# Patient Record
Sex: Male | Born: 1950 | Hispanic: Yes | Marital: Married | Smoking: Former smoker
Health system: Southern US, Community
[De-identification: ages and names within clinical notes are randomized; demographics above are authoritative.]

## PROBLEM LIST (undated history)

## (undated) DIAGNOSIS — E785 Hyperlipidemia, unspecified: Secondary | ICD-10-CM

## (undated) DIAGNOSIS — E041 Nontoxic single thyroid nodule: Secondary | ICD-10-CM

## (undated) DIAGNOSIS — I2510 Atherosclerotic heart disease of native coronary artery without angina pectoris: Secondary | ICD-10-CM

## (undated) DIAGNOSIS — I493 Ventricular premature depolarization: Secondary | ICD-10-CM

## (undated) HISTORY — DX: Ventricular premature depolarization: I49.3

## (undated) HISTORY — PX: KNEE SURGERY: SHX244

## (undated) HISTORY — PX: NASAL SINUS SURGERY: SHX719

## (undated) HISTORY — DX: Hyperlipidemia, unspecified: E78.5

## (undated) HISTORY — PX: OTHER SURGICAL HISTORY: SHX169

## (undated) HISTORY — PX: CHOLECYSTECTOMY: SHX55

## (undated) HISTORY — DX: Nontoxic single thyroid nodule: E04.1

## (undated) HISTORY — DX: Atherosclerotic heart disease of native coronary artery without angina pectoris: I25.10

---

## 2017-07-25 LAB — PULMONARY FUNCTION TEST

## 2017-08-16 ENCOUNTER — Other Ambulatory Visit: Payer: Self-pay | Admitting: Otolaryngology

## 2017-08-16 DIAGNOSIS — E041 Nontoxic single thyroid nodule: Secondary | ICD-10-CM

## 2017-08-24 ENCOUNTER — Ambulatory Visit
Admission: RE | Admit: 2017-08-24 | Discharge: 2017-08-24 | Disposition: A | Discharge status: Home / Self Care | Payer: Self-pay | Source: Ambulatory Visit | Attending: Otolaryngology | Admitting: Otolaryngology

## 2017-08-24 ENCOUNTER — Other Ambulatory Visit: Payer: Self-pay

## 2017-08-24 DIAGNOSIS — E041 Nontoxic single thyroid nodule: Secondary | ICD-10-CM

## 2017-08-30 ENCOUNTER — Other Ambulatory Visit: Payer: Self-pay | Admitting: Otolaryngology

## 2017-08-30 DIAGNOSIS — E041 Nontoxic single thyroid nodule: Secondary | ICD-10-CM

## 2017-09-12 ENCOUNTER — Ambulatory Visit
Admission: RE | Admit: 2017-09-12 | Discharge: 2017-09-12 | Disposition: A | Discharge status: Home / Self Care | Payer: PRIVATE HEALTH INSURANCE | Source: Ambulatory Visit | Attending: Otolaryngology | Admitting: Otolaryngology

## 2017-09-12 ENCOUNTER — Other Ambulatory Visit (HOSPITAL_COMMUNITY)
Admission: RE | Admit: 2017-09-12 | Discharge: 2017-09-12 | Disposition: A | Discharge status: Home / Self Care | Payer: PRIVATE HEALTH INSURANCE | Source: Ambulatory Visit | Attending: Student | Admitting: Student

## 2017-09-12 DIAGNOSIS — E041 Nontoxic single thyroid nodule: Secondary | ICD-10-CM

## 2017-09-26 ENCOUNTER — Other Ambulatory Visit: Payer: PRIVATE HEALTH INSURANCE

## 2017-11-05 NOTE — Progress Notes (Signed)
Referring-Prateek Shelly Flatten MD Reason for referral-CAD  HPI: 67 year old male for evaluation of CAD at request of Raelene Bott MD. patient previously followed in [place].  Previously diagnosed with coronary artery disease based on calcium on CT scan.  Multiple records reviewed today.  Chest CT March 2019 showed atherosclerotic calcification.  There was note of a right thyroid nodule and thyroid ultrasound recommended.  Exercise treadmill November 2016 showed no ischemia.  Holter monitor March 2018 showed frequent PVCs (14.9 K in 24 hrs).  Echocardiogram March 2018 showed ejection fraction 60%, mild right ventricular enlargement, mild right atrial enlargement and mild diastolic dysfunction.  Laboratories April 2019 showed potassium 4.1, BUN 10, creatinine 0.98.  Liver functions normal.  Total cholesterol 191 with LDL 97.  Patient denies dyspnea, chest pain, palpitations or syncope.  No claudication.   Current Outpatient Medications  Medication Sig Dispense Refill  . aspirin EC 81 MG tablet Take 2 tablets by mouth daily.    . DOCOSAHEXAENOIC ACID PO Take 0.2 g by mouth daily.     . metoprolol tartrate (LOPRESSOR) 25 MG tablet Take 1 tablet by mouth daily.    . montelukast (SINGULAIR) 10 MG tablet Take 10 mg by mouth daily.    . rosuvastatin (CRESTOR) 10 MG tablet Take 10 mg by mouth daily.    . tamsulosin (FLOMAX) 0.4 MG CAPS capsule Take 0.4 mg by mouth daily.    . vitamin B-12 (CYANOCOBALAMIN) 1000 MCG tablet Take 1 tablet by mouth daily.     No current facility-administered medications for this visit.     Allergies  Allergen Reactions  . Levofloxacin Itching     Past Medical History:  Diagnosis Date  . CAD (coronary artery disease)   . Hyperlipidemia   . PVC's (premature ventricular contractions)   . Thyroid nodule     Past Surgical History:  Procedure Laterality Date  . broken mandible    . CHOLECYSTECTOMY    . KNEE SURGERY    . NASAL SINUS SURGERY        Social History   Socioeconomic History  . Marital status: Married    Spouse name: Not on file  . Number of children: 3  . Years of education: Not on file  . Highest education level: Not on file  Occupational History    Comment: Food distributor  Social Needs  . Financial resource strain: Not on file  . Food insecurity:    Worry: Not on file    Inability: Not on file  . Transportation needs:    Medical: Not on file    Non-medical: Not on file  Tobacco Use  . Smoking status: Former Smoker    Last attempt to quit: 11/13/2002    Years since quitting: 15.0  . Smokeless tobacco: Never Used  Substance and Sexual Activity  . Alcohol use: Yes    Comment: Rare  . Drug use: Not on file  . Sexual activity: Not on file  Lifestyle  . Physical activity:    Days per week: Not on file    Minutes per session: Not on file  . Stress: Not on file  Relationships  . Social connections:    Talks on phone: Not on file    Gets together: Not on file    Attends religious service: Not on file    Active member of club or organization: Not on file    Attends meetings of clubs or organizations: Not on file  Relationship status: Not on file  . Intimate partner violence:    Fear of current or ex partner: Not on file    Emotionally abused: Not on file    Physically abused: Not on file    Forced sexual activity: Not on file  Other Topics Concern  . Not on file  Social History Narrative  . Not on file    Family History  Problem Relation Age of Onset  . COPD Mother   . Alcoholism Father     ROS: no fevers or chills, productive cough, hemoptysis, dysphasia, odynophagia, melena, hematochezia, dysuria, hematuria, rash, seizure activity, orthopnea, PND, pedal edema, claudication. Remaining systems are negative.  Physical Exam:   Blood pressure 106/70, pulse 64, height 5\' 10"  (1.778 m), weight 200 lb (90.7 kg).  General:  Well developed/well nourished in NAD Skin warm/dry Patient not  depressed No peripheral clubbing Back-normal HEENT-normal/normal eyelids Neck supple/normal carotid upstroke bilaterally; no bruits; no JVD; no thyromegaly chest - CTA/ normal expansion CV - RRR/normal S1 and S2; no murmurs, rubs or gallops;  PMI nondisplaced Abdomen -NT/ND, no HSM, no mass, + bowel sounds, no bruit 2+ femoral pulses, no bruits Ext-no edema, chords, 2+ DP Neuro-grossly nonfocal  ECG -normal sinus rhythm at a rate of 64.  Incomplete right bundle branch block.  Personally reviewed  A/P  1 coronary artery disease-patient is not having chest pain.  Plan medical therapy including aspirin and statin.  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  However his LV function is normal and he is not having significant palpitations.  No history of syncope.  Plan to continue beta-blocker.  Will repeat echocardiogram.  If LV function deteriorates may need consideration of antiarrhythmic versus ablation to suppress PVCs as this could potentially cause a cardiomyopathy in the future.  3 hyperlipidemia-continue statin.  Last LDL greater than 70.  Increase Crestor to 20 mg daily.  Check lipids and liver in 4 weeks.  4 thyroid nodule-noted on previous CT scan.  Follow-up biopsy negative per patient.   5 patient will need abdominal ultrasound when he turns 67 to screen for aneurysm.  We will arrange.  Olga MillersBrian Crenshaw, MD

## 2017-11-12 ENCOUNTER — Ambulatory Visit: Payer: PRIVATE HEALTH INSURANCE | Admitting: Cardiology

## 2017-11-12 ENCOUNTER — Encounter: Payer: Self-pay | Admitting: Cardiology

## 2017-11-12 VITALS — BP 106/70 | HR 64 | Ht 70.0 in | Wt 200.0 lb

## 2017-11-12 DIAGNOSIS — E785 Hyperlipidemia, unspecified: Secondary | ICD-10-CM | POA: Diagnosis not present

## 2017-11-12 DIAGNOSIS — Z136 Encounter for screening for cardiovascular disorders: Secondary | ICD-10-CM | POA: Diagnosis not present

## 2017-11-12 DIAGNOSIS — I2510 Atherosclerotic heart disease of native coronary artery without angina pectoris: Secondary | ICD-10-CM

## 2017-11-12 DIAGNOSIS — Z79899 Other long term (current) drug therapy: Secondary | ICD-10-CM | POA: Diagnosis not present

## 2017-11-12 DIAGNOSIS — R002 Palpitations: Secondary | ICD-10-CM

## 2017-11-12 MED ORDER — ROSUVASTATIN CALCIUM 20 MG PO TABS: 20.0000 mg | ORAL_TABLET | Freq: Every day | ORAL | 3 refills | Status: DC

## 2017-11-12 NOTE — Patient Instructions (Signed)
Medication Instructions:  Increase rosuvastatin (Crestor) to 20 mg daily  Labwork: Please return for FASTING labs in 4 weeks (Lipid, Hepatic)  Our in office lab hours are Monday-Friday 8:00-4:00, closed for lunch 12:45-1:45 pm.  No appointment needed.  Testing/Procedures: Your physician has requested that you have an echocardiogram. Echocardiography is a painless test that uses sound waves to create images of your heart. It provides your doctor with information about the size and shape of your heart and how well your heart's chambers and valves are working. This procedure takes approximately one hour. There are no restrictions for this procedure.  [facility]:  [address]  Your physician has requested that you have an abdominal aorta duplex in January 2020. During this test, an ultrasound is used to evaluate the aorta. Allow 30 minutes for this exam. Do not eat after midnight the day before and avoid carbonated beverages  Follow-Up: Your physician wants you to follow-up in: 1 year with Dr. Shelda Palrenshaw You will receive a reminder letter in the mail two months in advance. If you don't receive a letter, please call our office to schedule the follow-up appointment.   Any Other Special Instructions Will Be Listed Below (If Applicable).     If you need a refill on your cardiac medications before your next appointment, please call your pharmacy.

## 2017-12-10 ENCOUNTER — Other Ambulatory Visit: Payer: Self-pay | Admitting: *Deleted

## 2017-12-10 MED ORDER — ROSUVASTATIN CALCIUM 20 MG PO TABS: 20.0000 mg | ORAL_TABLET | Freq: Every day | ORAL | 3 refills | Status: DC

## 2017-12-22 LAB — LIPID PANEL
CHOLESTEROL TOTAL: 129 mg/dL (ref 100–199)
Chol/HDL Ratio: 3.1 ratio (ref 0.0–5.0)
HDL: 42 mg/dL (ref >39)
LDL Calculated: 59 mg/dL (ref 0–99)
Triglycerides: 142 mg/dL (ref 0–149)
VLDL Cholesterol Cal: 28 mg/dL (ref 5–40)

## 2017-12-22 LAB — HEPATIC FUNCTION PANEL
ALBUMIN: 4.4 g/dL (ref 3.6–4.8)
ALT: 17 IU/L (ref 0–44)
AST: 26 IU/L (ref 0–40)
Alkaline Phosphatase: 60 IU/L (ref 39–117)
BILIRUBIN, DIRECT: 0.2 mg/dL (ref 0.00–0.40)
Bilirubin Total: 0.7 mg/dL (ref 0.0–1.2)
TOTAL PROTEIN: 6.9 g/dL (ref 6.0–8.5)

## 2018-03-11 ENCOUNTER — Other Ambulatory Visit (HOSPITAL_COMMUNITY): Payer: 59

## 2018-04-25 ENCOUNTER — Other Ambulatory Visit: Payer: Self-pay

## 2018-04-25 NOTE — Telephone Encounter (Signed)
Order Providers   Prescribing Provider Encounter Provider  Lewayne Buntingrenshaw, Brian S, MD Recardo EvangelistWilliamson, Crystal, CMA  Outpatient Medication Detail    Disp Refills Start End   rosuvastatin (CRESTOR) 20 MG tablet 90 tablet 3 12/10/2017    Sig - Route: Take 1 tablet (20 mg total) by mouth daily. - Oral   Sent to pharmacy as: rosuvastatin (CRESTOR) 20 MG tablet   E-Prescribing Status: Receipt confirmed by pharmacy (12/10/2017 3:31 PM EDT)   Pharm[facility]   [facility] [place] - [phone]

## 2018-04-25 NOTE — Telephone Encounter (Signed)
°*  STAT* If patient is at the pharmacy, call can be transferred to refill team.   1. Which medications need to be refilled? (please list name of each medication and dose if known) Rosuvastatin  2. Which pharmacy/location (including street and city if local pharmacy) is medication to be sent to? [facility]  3. Do they need a 30 day or 90 day supply? 90 and refills

## 2018-04-29 ENCOUNTER — Telehealth: Payer: Self-pay | Admitting: Cardiology

## 2018-04-29 MED ORDER — ROSUVASTATIN CALCIUM 20 MG PO TABS: 20.0000 mg | ORAL_TABLET | Freq: Every day | ORAL | 3 refills | Status: AC

## 2018-04-29 NOTE — Telephone Encounter (Signed)
Refill sent to the pharmacy electronically.  

## 2018-04-29 NOTE — Telephone Encounter (Signed)
° ° ° ° °  1. Which medications need to be refilled? (please list name of each medication and dose if known) rosuvastatin (CRESTOR) 20 MG tablet  2. Which pharmacy/location (including street and city if local pharmacy) is medication to be sent to? [facility] [place] - [phone]   3. Do they need a 30 day or 90 day supply? 90

## 2018-05-13 ENCOUNTER — Ambulatory Visit (HOSPITAL_COMMUNITY)
Admission: RE | Admit: 2018-05-13 | Payer: PRIVATE HEALTH INSURANCE | Source: Ambulatory Visit | Attending: Cardiology | Admitting: Cardiology

## 2018-05-20 ENCOUNTER — Encounter (INDEPENDENT_AMBULATORY_CARE_PROVIDER_SITE_OTHER): Payer: Self-pay

## 2018-05-20 ENCOUNTER — Ambulatory Visit (HOSPITAL_COMMUNITY)
Admission: RE | Admit: 2018-05-20 | Discharge: 2018-05-20 | Disposition: A | Discharge status: Home / Self Care | Payer: Medicare Other | Source: Ambulatory Visit | Attending: Cardiology | Admitting: Cardiology

## 2018-05-20 DIAGNOSIS — Z136 Encounter for screening for cardiovascular disorders: Secondary | ICD-10-CM | POA: Diagnosis present

## 2018-05-20 DIAGNOSIS — Z87891 Personal history of nicotine dependence: Secondary | ICD-10-CM

## 2018-06-05 ENCOUNTER — Encounter: Payer: Self-pay | Admitting: Pulmonary Disease

## 2018-06-05 ENCOUNTER — Ambulatory Visit (INDEPENDENT_AMBULATORY_CARE_PROVIDER_SITE_OTHER): Payer: Medicare Other | Admitting: Pulmonary Disease

## 2018-06-05 ENCOUNTER — Ambulatory Visit (INDEPENDENT_AMBULATORY_CARE_PROVIDER_SITE_OTHER)
Admission: RE | Admit: 2018-06-05 | Discharge: 2018-06-05 | Disposition: A | Discharge status: Home / Self Care | Payer: Medicare Other | Source: Ambulatory Visit | Attending: Pulmonary Disease | Admitting: Pulmonary Disease

## 2018-06-05 VITALS — BP 124/82 | HR 66 | Ht 70.0 in | Wt 198.0 lb

## 2018-06-05 DIAGNOSIS — R05 Cough: Secondary | ICD-10-CM | POA: Diagnosis not present

## 2018-06-05 DIAGNOSIS — R059 Cough, unspecified: Secondary | ICD-10-CM

## 2018-06-05 LAB — CBC WITH DIFFERENTIAL/PLATELET
Basophils Absolute: 0 10*3/uL (ref 0.0–0.1)
Basophils Relative: 0.7 % (ref 0.0–3.0)
Eosinophils Absolute: 0.3 10*3/uL (ref 0.0–0.7)
Eosinophils Relative: 4.4 % (ref 0.0–5.0)
HCT: 45.3 % (ref 39.0–52.0)
Hemoglobin: 15.8 g/dL (ref 13.0–17.0)
LYMPHS ABS: 1.7 10*3/uL (ref 0.7–4.0)
Lymphocytes Relative: 24.2 % (ref 12.0–46.0)
MCHC: 34.8 g/dL (ref 30.0–36.0)
MCV: 89.1 fl (ref 78.0–100.0)
MONOS PCT: 9.3 % (ref 3.0–12.0)
Monocytes Absolute: 0.7 10*3/uL (ref 0.1–1.0)
Neutro Abs: 4.5 10*3/uL (ref 1.4–7.7)
Neutrophils Relative %: 61.4 % (ref 43.0–77.0)
Platelets: 194 10*3/uL (ref 150.0–400.0)
RBC: 5.08 Mil/uL (ref 4.22–5.81)
RDW: 14 % (ref 11.5–15.5)
WBC: 7.2 10*3/uL (ref 4.0–10.5)

## 2018-06-05 LAB — NITRIC OXIDE: Nitric Oxide: 28

## 2018-06-05 MED ORDER — FLUTICASONE-UMECLIDIN-VILANT 100-62.5-25 MCG/INH IN AEPB: 1.0000 | INHALATION_SPRAY | Freq: Every day | RESPIRATORY_TRACT | 1 refills | Status: AC

## 2018-06-05 NOTE — Patient Instructions (Addendum)
We will start chlorpheniramine 8 mg 3 times daily for reduction of postnasal drip.  This medication is available over-the-counter Continue the Flonase nasal spray Start you on trelegy inhaler for chest congestion, wheezing We will reevaluate her lungs with a chest x-ray, PFTs, CBC differential, IgE, alpha-1 antitrypsin levels and phenotype Follow-up in 2 to 4 weeks.

## 2018-06-05 NOTE — Progress Notes (Signed)
Patient seen in the office today and instructed on use of Trelegy.  Patient expressed understanding and demonstrated technique.  

## 2018-06-05 NOTE — Progress Notes (Signed)
Jonathan Santana    [id number]    Nov 22, 1950  Primary Care Physician:Morrow, Clifton CustardAaron, MD  Referring Physician: Farris HasMorrow, Aaron, MD [address] [place], [place] [address]  Chief complaint: Consult for persistent cough, wheezing  HPI: 68 year old with significant history of coronary artery disease, hyperlipidemia, chronic sinusitis Complains of constant cough with postnasal drip, sinus congestion since fall 2019.  Reports symptoms began after he got a flu shot.  He also has mild dyspnea on exertion, wheezing at night He seen his primary care and given Tessalon Perles and Mucinex which helps with symptoms.   Followed by ENT for chronic sinusitis.  Underwent bilateral endoscopic sinus surgery and inferior turbinate reduction on 10/18/2017 Moved from [place] a year ago. [place] and was told lungs are normal. Reports episodes of bronchitis with congestion every year for which she receives antibiotics and prednisone.   Never been on any inhalers.  Denies GERD symptoms. He was evaluated by allergy specialist last year and allergy testing was negative per patient.  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  Quit in 2004 Travel history: Originally from [place].  [place]oved to [place] in [place] Relevant family history: Parents and sisters had emphysema.  They were all smokers.  Has a strong family history of asthma.  Outpatient Encounter Medications as of 06/05/2018  Medication Sig  . aspirin EC 81 MG tablet Take 2 tablets by mouth daily.  . metoprolol tartrate (LOPRESSOR) 25 MG tablet Take 1 tablet by mouth daily.  . montelukast (SINGULAIR) 10 MG tablet Take 10 mg by mouth daily.  . rosuvastatin (CRESTOR) 20 MG tablet Take 1 tablet (20 mg total) by mouth daily.  . tamsulosin (FLOMAX) 0.4 MG CAPS  capsule Take 0.4 mg by mouth daily.  . [DISCONTINUED] DOCOSAHEXAENOIC ACID PO Take 0.2 g by mouth daily.   . [DISCONTINUED] vitamin B-12 (CYANOCOBALAMIN) 1000 MCG tablet Take 1 tablet by mouth daily.   No facility-administered encounter medications on file as of 06/05/2018.     Allergies as of 06/05/2018 - Review Complete 06/05/2018  Allergen Reaction Noted  . Levofloxacin Itching 08/17/2017    Past Medical History:  Diagnosis Date  . CAD (coronary artery disease)   . Hyperlipidemia   . PVC's (premature ventricular contractions)   . Thyroid nodule     Past Surgical History:  Procedure Laterality Date  . broken mandible    . CHOLECYSTECTOMY    . KNEE SURGERY    . NASAL SINUS SURGERY      Family History  Problem Relation Age of Onset  . COPD Mother   . Asthma Mother   . Alcoholism Father   . COPD Sister   . Asthma Sister     Social History   Socioeconomic History  . Marital status: Married    Spouse name: Not on file  . Number of children: 3  . Years of education: Not on file  . Highest education level: Not on file  Occupational History    Comment: Food distributor  Social Needs  . Financial resource strain: Not on file  . Food insecurity:    Worry: Not on file    Inability: Not on file  . Transportation needs:    Medical: Not on file    Non-medical: Not on file  Tobacco  Use  . Smoking status: Former Smoker    Last attempt to quit: 11/13/2002    Years since quitting: 15.5  . Smokeless tobacco: Never Used  Substance and Sexual Activity  . Alcohol use: Yes    Alcohol/week: 1.0 standard drinks    Types: 1 Glasses of wine per week    Comment: Rare  . Drug use: Not on file  . Sexual activity: Not on file  Lifestyle  . Physical activity:    Days per week: Not on file    Minutes per session: Not on file  . Stress: Not on file  Relationships  . Social connections:    Talks on phone: Not on file    Gets together: Not on file    Attends religious service:  Not on file    Active member of club or organization: Not on file    Attends meetings of clubs or organizations: Not on file    Relationship status: Not on file  . Intimate partner violence:    Fear of current or ex partner: Not on file    Emotionally abused: Not on file    Physically abused: Not on file    Forced sexual activity: Not on file  Other Topics Concern  . Not on file  Social History Narrative  . Not on file    Review of systems: Review of Systems  Constitutional: Negative for fever and chills.  HENT: Negative.   Eyes: Negative for blurred vision.  Respiratory: as per HPI  Cardiovascular: Negative for chest pain and palpitations.  Gastrointestinal: Negative for vomiting, diarrhea, blood per rectum. Genitourinary: Negative for dysuria, urgency, frequency and hematuria.  Musculoskeletal: Negative for myalgias, back pain and joint pain.  Skin: Negative for itching and rash.  Neurological: Negative for dizziness, tremors, focal weakness, seizures and loss of consciousness.  Endo/Heme/Allergies: Negative for environmental allergies.  Psychiatric/Behavioral: Negative for depression, suicidal ideas and hallucinations.  All other systems reviewed and are negative.  Physical Exam: Blood pressure 124/82, pulse 66, height 5\' 10"  (1.778 m), weight 198 lb (89.8 kg), SpO2 95 %. Gen:      No acute distress HEENT:  EOMI, sclera anicteric Neck:     No masses; no thyromegaly Lungs:    Clear to auscultation bilaterally; normal respiratory effort CV:         Regular rate and rhythm; no murmurs Abd:      + bowel sounds; soft, non-tender; no palpable masses, no distension Ext:    No edema; adequate peripheral perfusion Skin:      Warm and dry; no rash Neuro: alert and oriented x 3 Psych: normal mood and affect  Data Reviewed: PFTs: 07/25/2017 (from [place]) FVC 4.69 [95%], FEV1 3.16 [94%), F/F 67 Mild obstructive airway disease.  FENO 06/05/2018-28  Assessment:  Assessment  for chronic cough with wheezing He has significant component of of upper airway cough from postnasal drip, chronic sinusitis He is also a ex smoker with mild obstructive airway disease on prior PFTs.  I suspect he may have underlying COPD  For postnasal drip he will continue Singulair, Flonase Start him on chlorpheniramine 8 mg 3 times daily Start Symbicort inhaler for wheezing Check CBC differential, IgE, alpha-1 antitrypsin Will order PFTs and chest x-ray for evaluation of the lungs.  Plan/Recommendations: - Chlorpheniramine - Continue Singulair, Flonase - Start Symbicort - PFTs, chest x-ray - CBC, IgE, alpha-1 antitrypsin  Chilton Greathouse MD [facility] 06/05/2018, 10:07 AM  CC: Farris Has,  MD   

## 2018-06-05 NOTE — Addendum Note (Signed)
Addended by: Demetrio Lapping E on: 06/05/2018 10:54 AM   Modules accepted: Orders

## 2018-06-06 ENCOUNTER — Telehealth: Payer: Self-pay | Admitting: Pulmonary Disease

## 2018-06-06 MED ORDER — FLUTICASONE-UMECLIDIN-VILANT 100-62.5-25 MCG/INH IN AEPB: 1.0000 | INHALATION_SPRAY | Freq: Every day | RESPIRATORY_TRACT | 0 refills | Status: AC

## 2018-06-06 NOTE — Telephone Encounter (Signed)
Called and spoke to pt.  Pt is requesting sample of Trelegy.  Pt also made aware that there is not a generic of Trelegy.  One sample of Trelegy has been placed up front for pick up. Nothing further is needed.

## 2018-06-13 LAB — IGE: IgE (Immunoglobulin E), Serum: 87 kU/L (ref <=114)

## 2018-06-13 LAB — ALPHA-1 ANTITRYPSIN PHENOTYPE: A-1 Antitrypsin, Ser: 156 mg/dL (ref 83–199)

## 2018-06-26 ENCOUNTER — Ambulatory Visit (INDEPENDENT_AMBULATORY_CARE_PROVIDER_SITE_OTHER): Payer: Medicare Other | Admitting: Pulmonary Disease

## 2018-06-26 ENCOUNTER — Encounter: Payer: Self-pay | Admitting: Pulmonary Disease

## 2018-06-26 VITALS — BP 122/74 | HR 60 | Ht 69.5 in | Wt 198.0 lb

## 2018-06-26 DIAGNOSIS — R05 Cough: Secondary | ICD-10-CM | POA: Diagnosis not present

## 2018-06-26 DIAGNOSIS — J4530 Mild persistent asthma, uncomplicated: Secondary | ICD-10-CM

## 2018-06-26 DIAGNOSIS — J449 Chronic obstructive pulmonary disease, unspecified: Secondary | ICD-10-CM

## 2018-06-26 DIAGNOSIS — R059 Cough, unspecified: Secondary | ICD-10-CM

## 2018-06-26 LAB — PULMONARY FUNCTION TEST
DL/VA % pred: 73 %
DL/VA: 3.03 ml/min/mmHg/L
DLCO cor % pred: 84 %
DLCO cor: 22.26 ml/min/mmHg
DLCO unc % pred: 87 %
DLCO unc: 22.98 ml/min/mmHg
FEF 25-75 Post: 2.84 L/sec
FEF 25-75 Pre: 1.54 L/sec
FEF2575-%Change-Post: 84 %
FEF2575-%Pred-Post: 106 %
FEF2575-%Pred-Pre: 57 %
FEV1-%CHANGE-POST: 17 %
FEV1-%Pred-Post: 104 %
FEV1-%Pred-Pre: 88 %
FEV1-POST: 3.51 L
FEV1-Pre: 2.99 L
FEV1FVC-%Change-Post: 6 %
FEV1FVC-%Pred-Pre: 88 %
FEV6-%Change-Post: 11 %
FEV6-%Pred-Post: 114 %
FEV6-%Pred-Pre: 102 %
FEV6-Post: 4.9 L
FEV6-Pre: 4.39 L
FEV6FVC-%Change-Post: 1 %
FEV6FVC-%Pred-Post: 103 %
FEV6FVC-%Pred-Pre: 102 %
FVC-%Change-Post: 10 %
FVC-%PRED-POST: 110 %
FVC-%PRED-PRE: 100 %
FVC-PRE: 4.54 L
FVC-Post: 5 L
PRE FEV6/FVC RATIO: 97 %
Post FEV1/FVC ratio: 70 %
Post FEV6/FVC ratio: 98 %
Pre FEV1/FVC ratio: 66 %
RV % pred: 115 %
RV: 2.69 L
TLC % pred: 110 %
TLC: 7.64 L

## 2018-06-26 NOTE — Progress Notes (Signed)
Jonathan Santana    [id number]    08-22-1950  Primary Care Physician:Morrow, Clifton Custard, MD  Referring Physician: Farris Has, MD [address] [place], [place] [address]  Chief complaint: Follow-up for mild COPD, asthma.  HPI: 68 year old with significant history of coronary artery disease, hyperlipidemia, chronic sinusitis Complains of constant cough with postnasal drip, sinus congestion since fall 2019.  Reports symptoms began after he got a flu shot.  He also has mild dyspnea on exertion, wheezing at night He seen his primary care and given Tessalon Perles and Mucinex which helps with symptoms.   Followed by ENT for chronic sinusitis.  Underwent bilateral endoscopic sinus surgery and inferior turbinate reduction on 10/18/2017 Moved from [place] a year ago. He had PFTs done in 2019 at [place] and was told lungs are normal. Reports episodes of bronchitis with congestion every year for which he receives antibiotics and prednisone.   Never been on any inhalers.  Denies GERD symptoms. He was evaluated by allergy specialist last year and allergy testing was negative per patient.  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  Quit in 2004 Travel history: Originally from [place].  Moved to [place] in 2019 Relevant family history: Parents and sisters had emphysema.  They were all smokers.  Has a strong family history of asthma.  Interim history: Started on chlorphentermine, Flonase and Trelegy at last visit States that his cough, chest congestion is much improved.  Does not notice wheezing at night  Outpatient Encounter Medications as of 06/26/2018  Medication Sig  . aspirin EC 81 MG tablet Take 2 tablets by mouth daily.  . metoprolol tartrate (LOPRESSOR) 25 MG tablet Take 1 tablet by mouth daily.  . montelukast  (SINGULAIR) 10 MG tablet Take 10 mg by mouth daily.  . rosuvastatin (CRESTOR) 20 MG tablet Take 1 tablet (20 mg total) by mouth daily.  . tamsulosin (FLOMAX) 0.4 MG CAPS capsule Take 0.4 mg by mouth 2 (two) times daily.    No facility-administered encounter medications on file as of 06/26/2018.     Allergies as of 06/26/2018 - Review Complete 06/26/2018  Allergen Reaction Noted  . Levofloxacin Itching 08/17/2017    Past Medical History:  Diagnosis Date  . CAD (coronary artery disease)   . Hyperlipidemia   . PVC's (premature ventricular contractions)   . Thyroid nodule     Past Surgical History:  Procedure Laterality Date  . broken mandible    . CHOLECYSTECTOMY    . KNEE SURGERY    . NASAL SINUS SURGERY      Family History  Problem Relation Age of Onset  . COPD Mother   . Asthma Mother   . Alcoholism Father   . COPD Sister   . Asthma Sister     Social History   Socioeconomic History  . Marital status: Married    Spouse name: Not on file  . Number of children: 3  . Years of education: Not on file  . Highest education level: Not on file  Occupational History    Comment: Food distributor  Social Needs  . Financial resource strain: Not on file  . Food insecurity:    Worry: Not on file    Inability: Not on file  . Transportation needs:    Medical: Not on file    Non-medical: Not  on file  Tobacco Use  . Smoking status: Former Smoker    Last attempt to quit: 11/13/2002    Years since quitting: 15.6  . Smokeless tobacco: Never Used  Substance and Sexual Activity  . Alcohol use: Yes    Alcohol/week: 1.0 standard drinks    Types: 1 Glasses of wine per week    Comment: Rare  . Drug use: Not on file  . Sexual activity: Not on file  Lifestyle  . Physical activity:    Days per week: Not on file    Minutes per session: Not on file  . Stress: Not on file  Relationships  . Social connections:    Talks on phone: Not on file    Gets together: Not on file     Attends religious service: Not on file    Active member of club or organization: Not on file    Attends meetings of clubs or organizations: Not on file    Relationship status: Not on file  . Intimate partner violence:    Fear of current or ex partner: Not on file    Emotionally abused: Not on file    Physically abused: Not on file    Forced sexual activity: Not on file  Other Topics Concern  . Not on file  Social History Narrative  . Not on file    Review of systems: Review of Systems  Constitutional: Negative for fever and chills.  HENT: Negative.   Eyes: Negative for blurred vision.  Respiratory: as per HPI  Cardiovascular: Negative for chest pain and palpitations.  Gastrointestinal: Negative for vomiting, diarrhea, blood per rectum. Genitourinary: Negative for dysuria, urgency, frequency and hematuria.  Musculoskeletal: Negative for myalgias, back pain and joint pain.  Skin: Negative for itching and rash.  Neurological: Negative for dizziness, tremors, focal weakness, seizures and loss of consciousness.  Endo/Heme/Allergies: Negative for environmental allergies.  Psychiatric/Behavioral: Negative for depression, suicidal ideas and hallucinations.  All other systems reviewed and are negative.  Physical Exam: Blood pressure 124/82, pulse 66, height 5\' 10"  (1.778 m), weight 198 lb (89.8 kg), SpO2 95 %. Gen:      No acute distress HEENT:  EOMI, sclera anicteric Neck:     No masses; no thyromegaly Lungs:    Clear to auscultation bilaterally; normal respiratory effort CV:         Regular rate and rhythm; no murmurs Abd:      + bowel sounds; soft, non-tender; no palpable masses, no distension Ext:    No edema; adequate peripheral perfusion Skin:      Warm and dry; no rash Neuro: alert and oriented x 3 Psych: normal mood and affect  Data Reviewed: Imaging: X-ray 06/05/2018- acute cardiopulmonary abnormality.  I have reviewed the images personally.  PFTs: 07/25/2017 (from [place]) FVC 4.69 [95%], FEV1 3.16 [94%), F/F 67 Mild obstructive airway disease.  FENO 06/05/2018-28  Assessment:  Mild COPD, asthma PFTs reviewed with mild obstruction, bronchodilator response He also he has significant component of of upper airway cough from postnasal drip, chronic sinusitis Sometimes overall improved from last visit  For postnasal drip he will continue Singulair, Flonase He can use chlorphentermine as needed going forward  Currently on Trelegy inhaler and wants to stop the inhaler as breathing is better We will observe him off inhalers.  He wants to follow-up as needed. Advised him to call back if his symptoms worsen.  Plan/Recommendations: - Chlorpheniramine PRN - Continue Singulair, Flonase  Chilton Greathouse MD [facility] 06/26/2018, 11:59 AM  CC: Farris HasMorrow, Aaron, MD

## 2018-06-26 NOTE — Progress Notes (Signed)
PFT done today. 

## 2018-06-26 NOTE — Patient Instructions (Signed)
I am glad you are starting to feel better If your symptoms recur then you can use over-the-counter antihistamine and continue on the Flonase nasal spray Follow-up in clinic as needed.

## 2018-11-26 IMAGING — US US THYROID
1 series · 13 of 25 positions shown · non-contrast
Comparison: None available

CLINICAL DATA: Right thyroid nodule

EXAM:
THYROID ULTRASOUND
TECHNIQUE: Ultrasound examination of the thyroid gland and adjacent soft
tissues was performed.

[Series 1: us thyroid · 0.05mm/px · 13 of 54 slices shown]
[im 1/54]
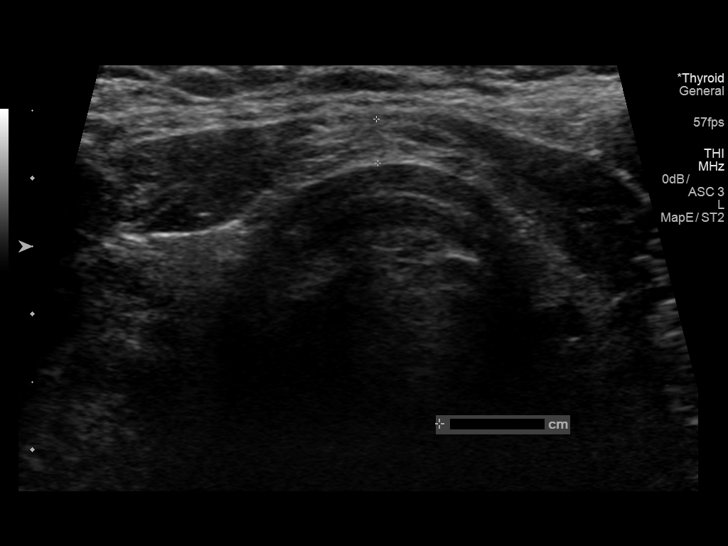
[im 5/54]
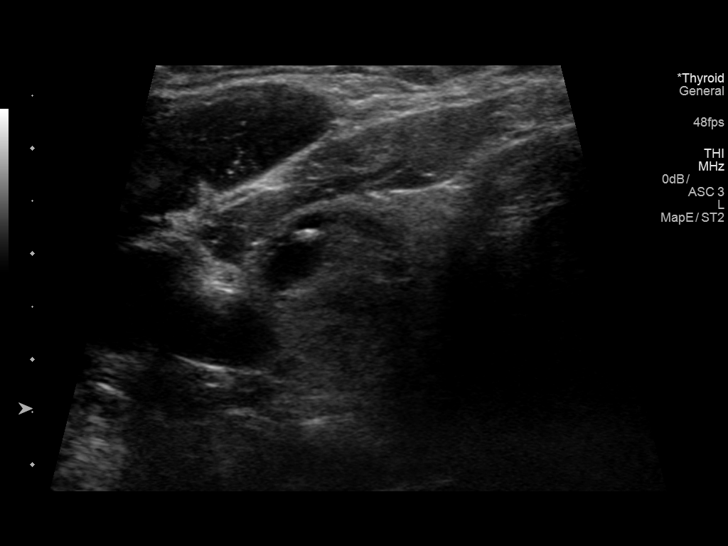
[im 9/54]
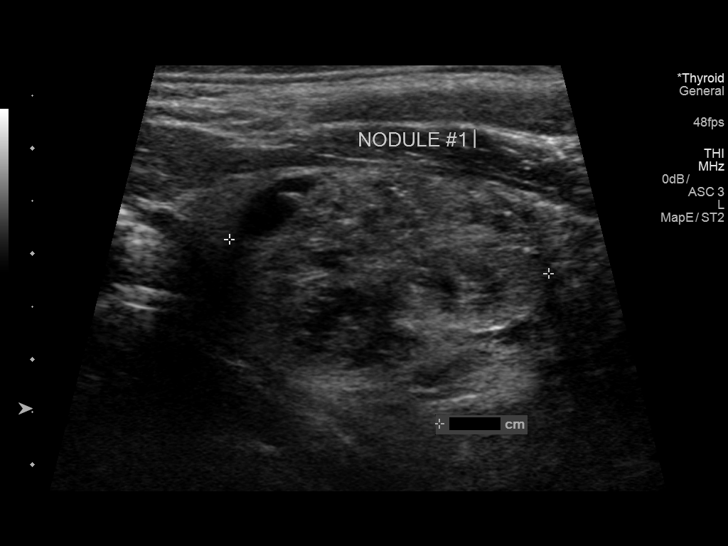
[im 14/54]
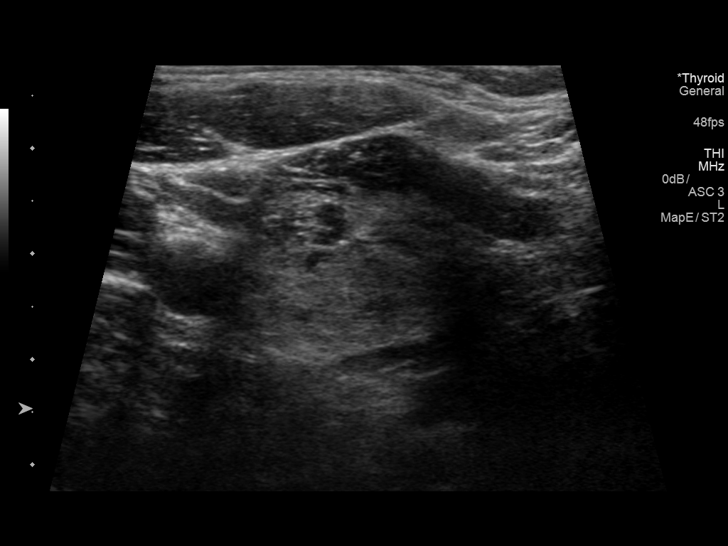
[im 18/54]
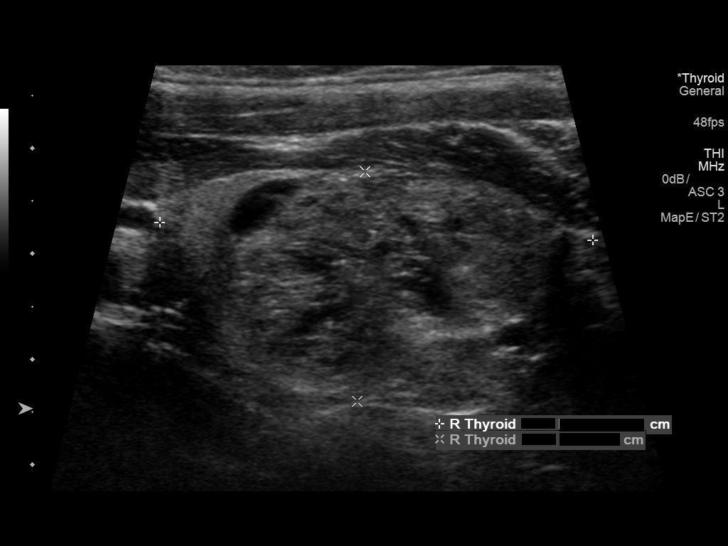
[im 23/54]
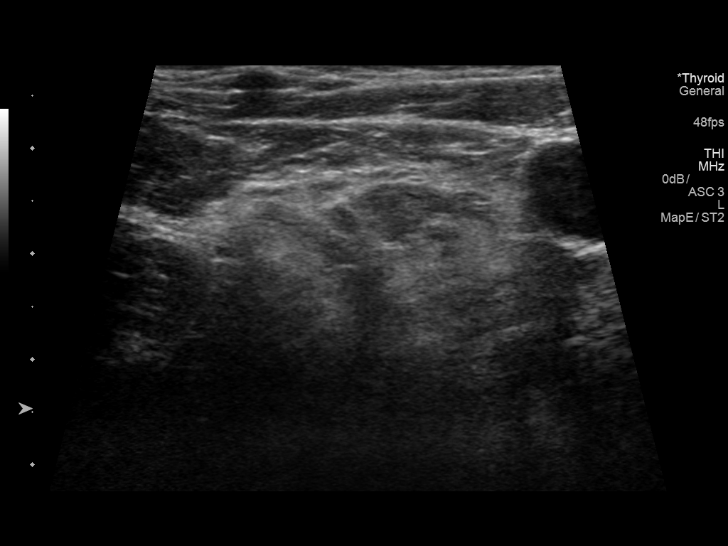
[im 27/54]
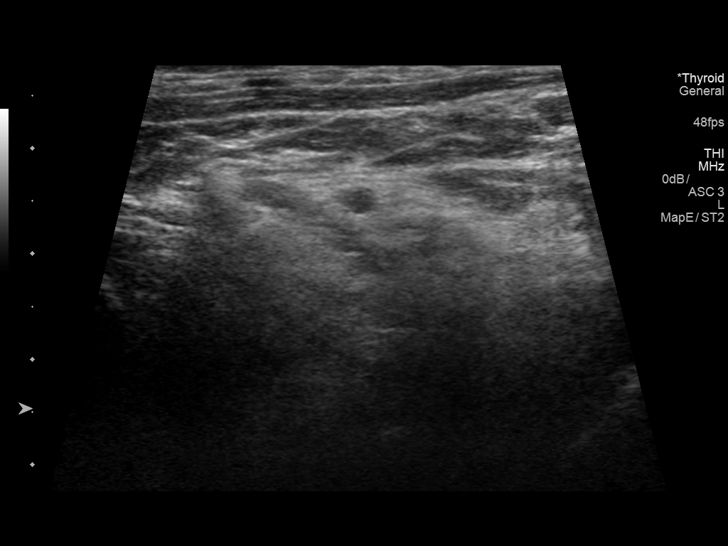
[im 31/54]
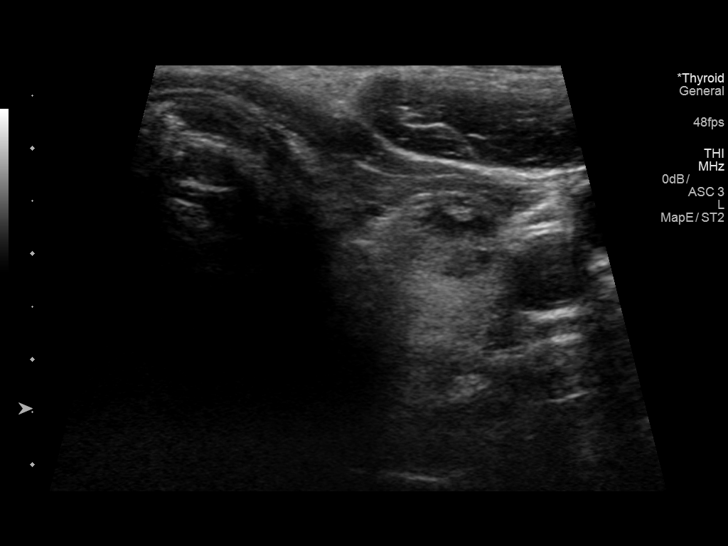
[im 36/54]
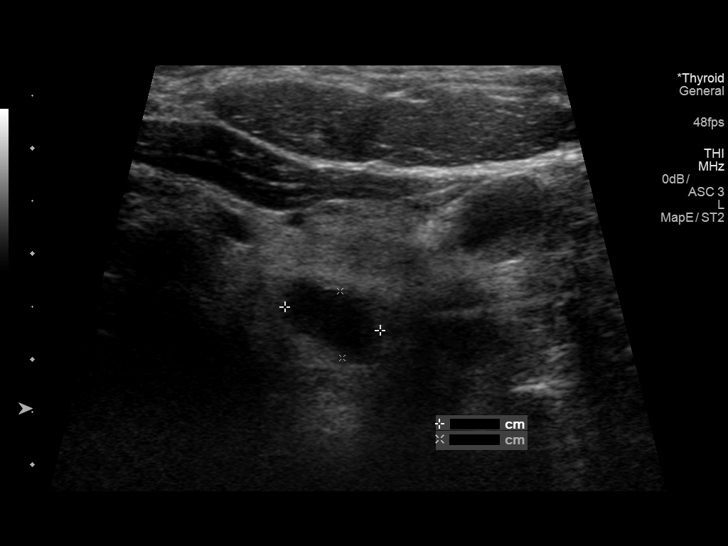
[im 40/54]
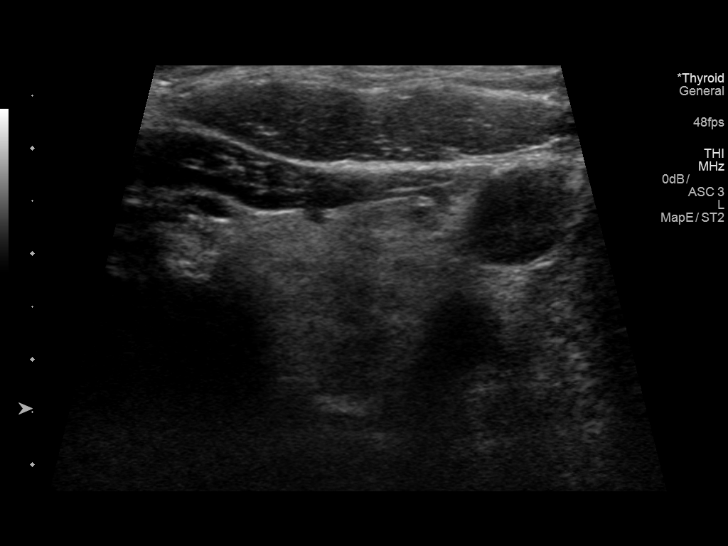
[im 45/54]
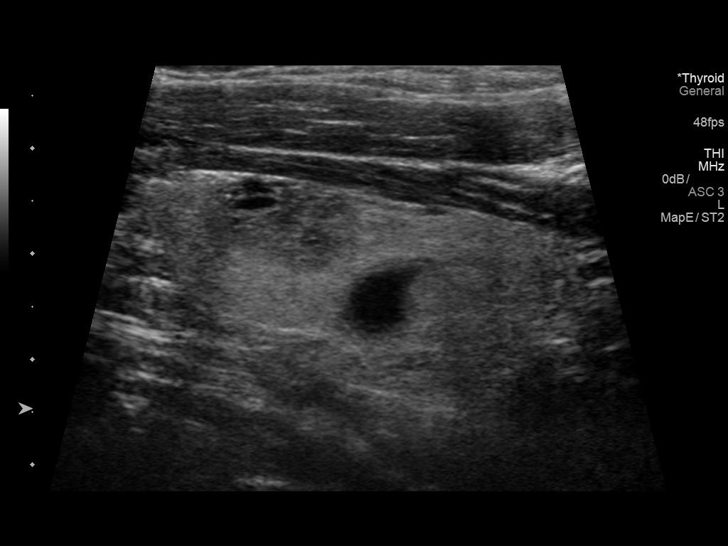
[im 49/54]
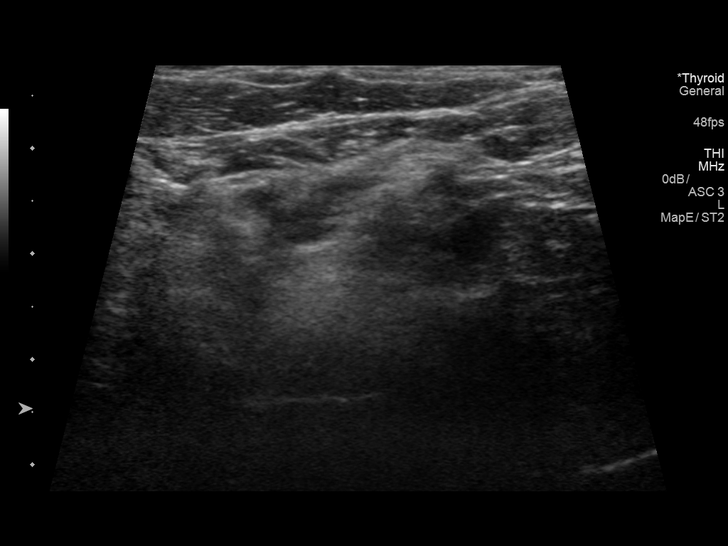
[im 54/54]
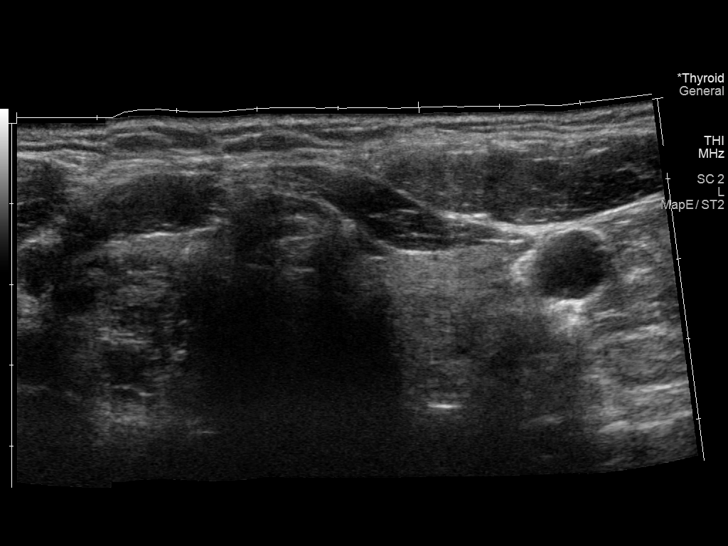

[13 of 25 positions shown; findings below may reference images not displayed]

FINDINGS: Parenchymal Echotexture: Moderately heterogenous

Isthmus: 3 mm

Right lobe: 4.1 x 2.2 x 2.4 cm

Left lobe: 4.4 x 2.3 x 2.1 cm

_________________________________________________________

Estimated total number of nodules >/= 1 cm: 2

Number of spongiform nodules >/=  2 cm not described below (TR1): 0

Number of mixed cystic and solid nodules >/= 1.5 cm not described
below (TR2): 0

_________________________________________________________

Nodule # 1:

Location: Right; Mid

Maximum size: 3.0 cm; Other 2 dimensions: 2.0 x 2.8 cm

Composition: solid/almost completely solid (2)

Echogenicity: isoechoic (1)

Shape: not taller-than-wide (0)

Margins: ill-defined (0)

Echogenic foci: punctate echogenic foci (3)

ACR TI-RADS total points: 6.

ACR TI-RADS risk category: TR4 (4-6 points).

ACR TI-RADS recommendations:

**Given size (>/= 1.5 cm) and appearance, fine needle aspiration of
this moderately suspicious nodule should be considered based on
TI-RADS criteria.

_________________________________________________________

Nodule # 2:

Location: Left; Superior

Maximum size: 1.7 cm; Other 2 dimensions: 0.9 x 1.1 cm

Composition: solid/almost completely solid (2)

Echogenicity: isoechoic (1)

Shape: not taller-than-wide (0)

Margins: ill-defined (0)

Echogenic foci: none (0)

ACR TI-RADS total points: 3.

ACR TI-RADS risk category: TR3 (3 points).

ACR TI-RADS recommendations:

*Given size (>/= 1.5 - 2.4 cm) and appearance, a follow-up
ultrasound in 1 year should be considered based on TI-RADS criteria.

_________________________________________________________

Additional anechoic cyst noted in the left lobe measures 9 mm. This
would not meet criteria for any biopsy or follow-up.

No adenopathy.
IMPRESSION: 3.0 cm right mid thyroid TR 4 nodule meets criteria for biopsy as
above.

1.7 cm left superior TR 3 nodule meets criteria follow-up in 1 year.

The above is in keeping with the ACR TI-RADS recommendations - [HOSPITAL] [id number];[DATE].

## 2018-12-15 IMAGING — US US FNA BIOPSY THYROID 1ST LESION
1 series · 13 of 17 positions shown · non-contrast
Comparison: US THYROID 08/24/2017

MEDICATIONS:
2 mL 1% lidocaine

COMPLICATIONS:
None immediate.

INDICATION: Indeterminate thyroid nodule of the right mid thyroid. Request is
made for fine-needle aspiration of indeterminate thyroid nodule.

EXAM:
ULTRASOUND GUIDED FINE NEEDLE ASPIRATION OF INDETERMINATE THYROID
NODULE
TECHNIQUE: Informed written consent was obtained from the patient after a
discussion of the risks, benefits and alternatives to treatment.
Questions regarding the procedure were encouraged and answered. A
timeout was performed prior to the initiation of the procedure.

[Series 1: us fna biopsy thyroid 1st lesion · 0.06mm/px · 17 acquisitions, 13 frames shown]
[im 1/17]
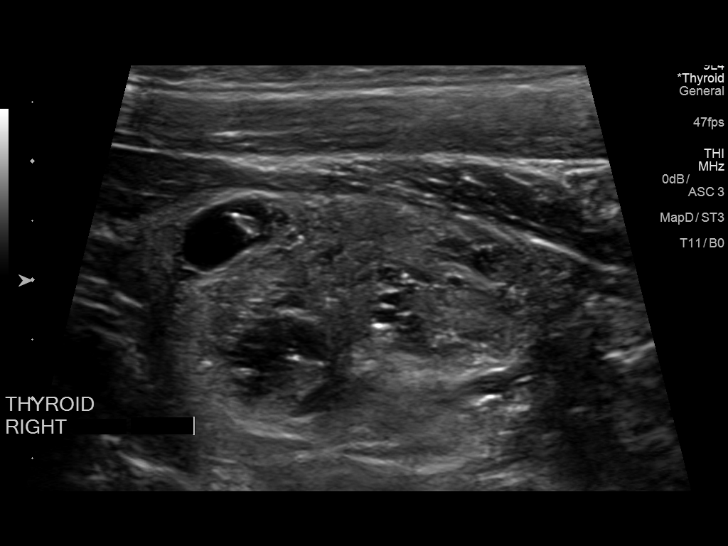
[im 2/17]
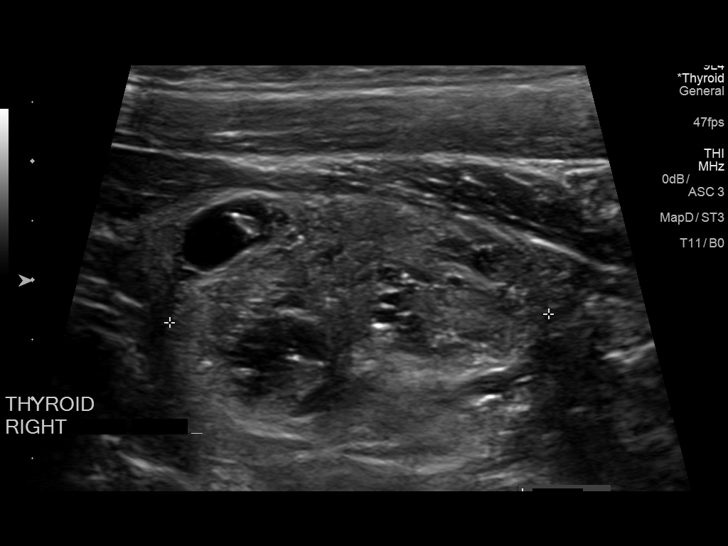
[im 4/17]
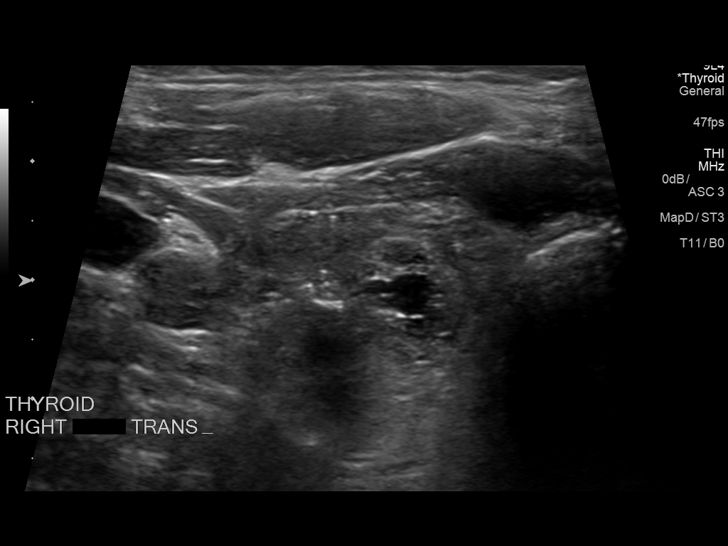
[im 5/17]
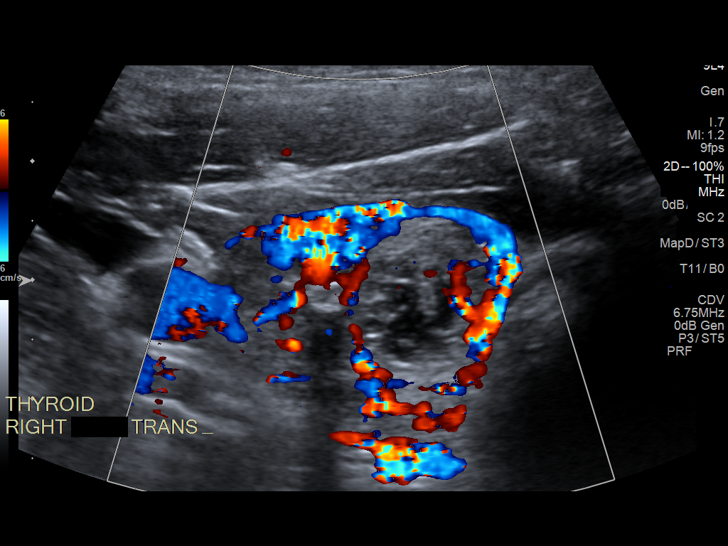
[im 6/17]
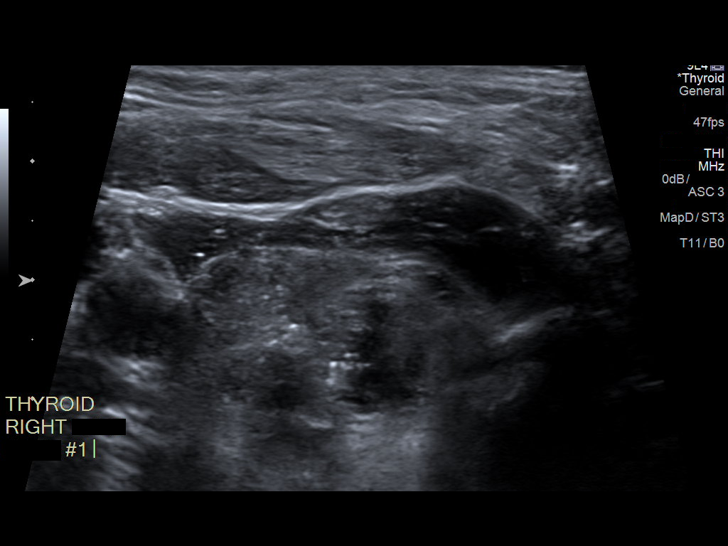
[im 8/17]
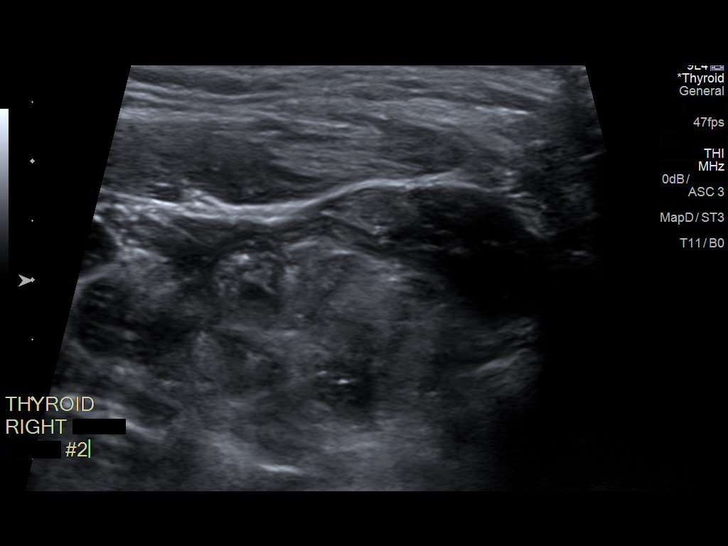
[im 9/17]
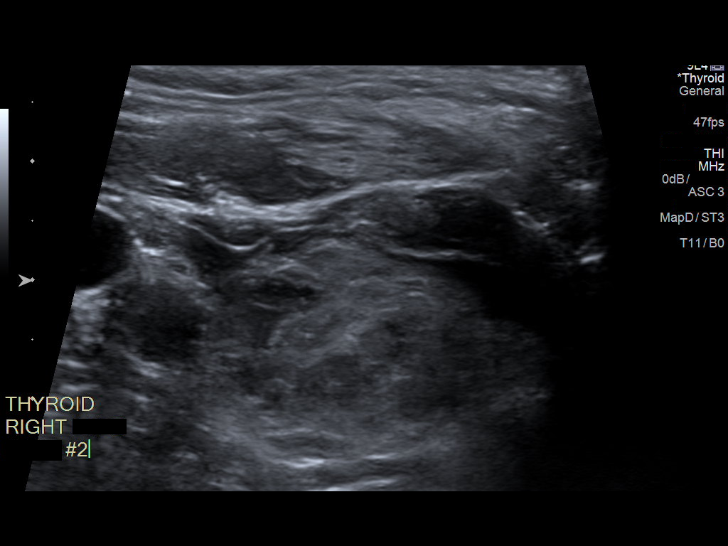
[im 10/17]
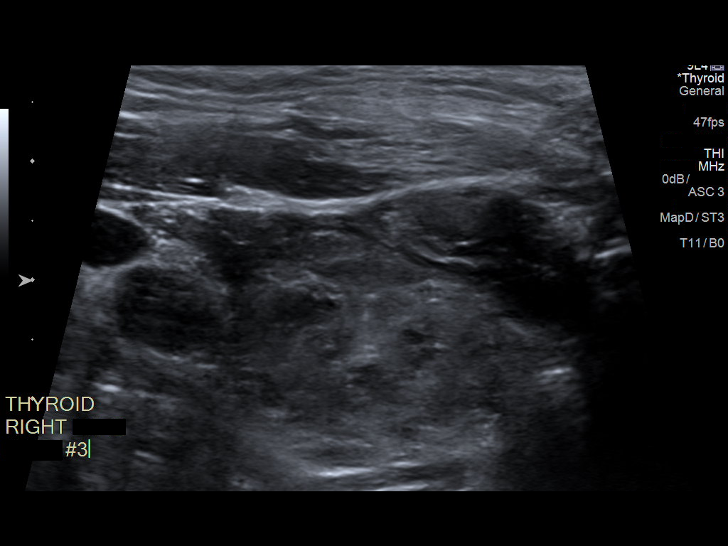
[im 12/17]
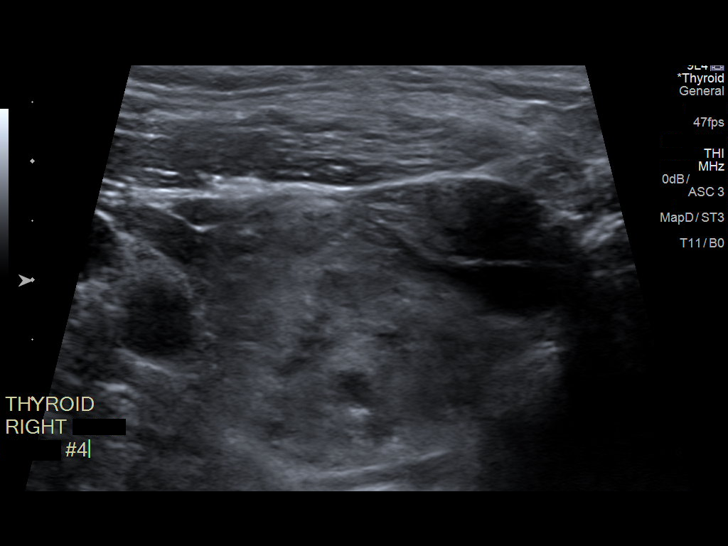
[im 13/17]
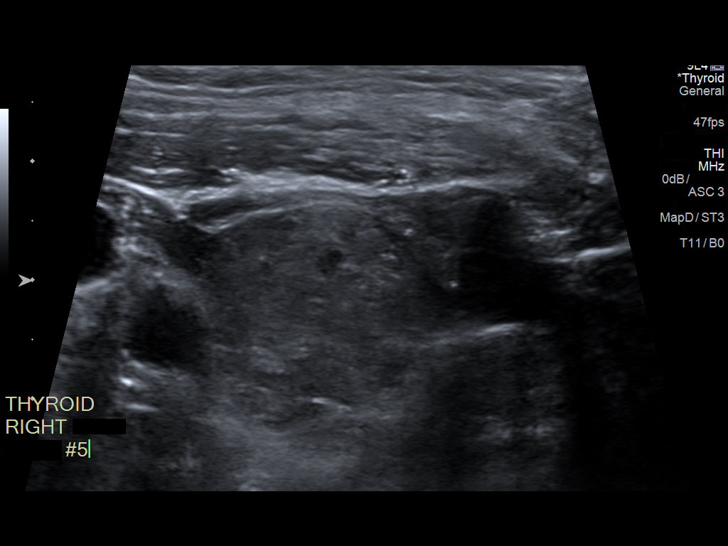
[im 14/17]
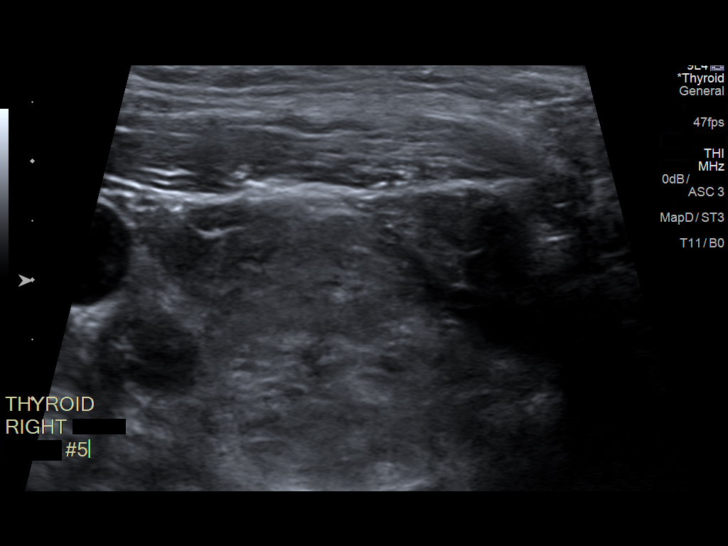
[im 16/17]
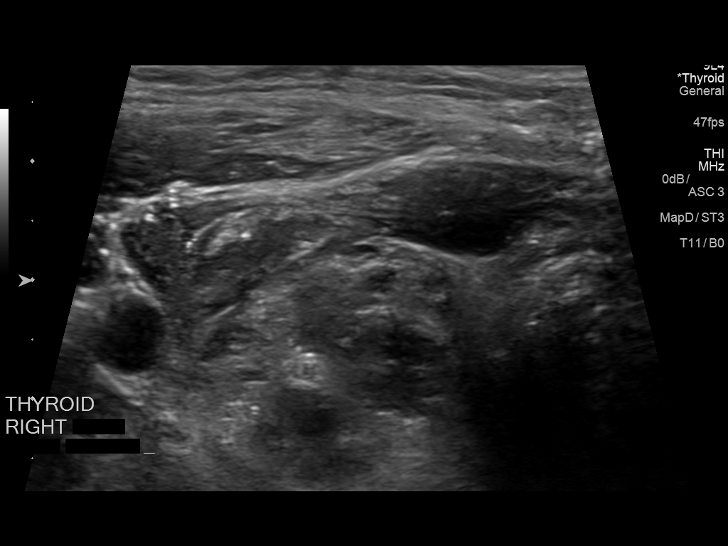
[im 17/17]
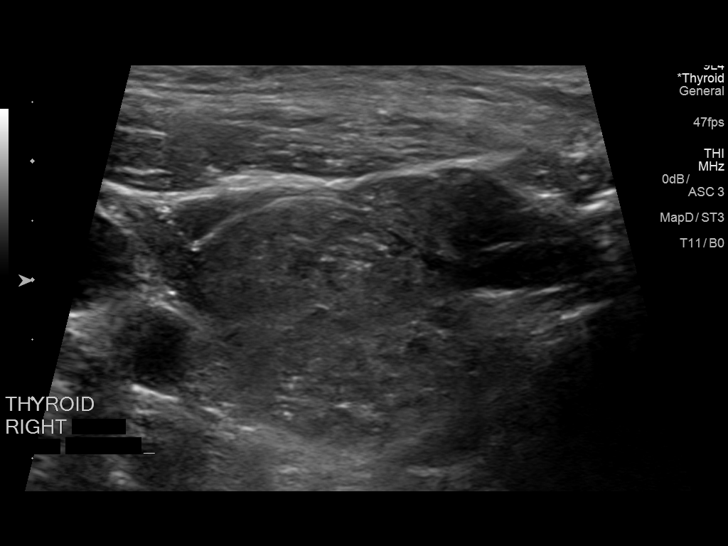

[13 of 17 positions shown; findings below may reference images not displayed]

Pre-procedural ultrasound scanning demonstrated unchanged size and
appearance of the indeterminate nodule within the right mid thyroid.

The procedure was planned. The neck was prepped in the usual sterile
fashion, and a sterile drape was applied covering the operative
field. A timeout was performed prior to the initiation of the
procedure. Local anesthesia was provided with 1% lidocaine.

Under direct ultrasound guidance, 5 FNA biopsies were performed of
the right mid thyroid nodule with a 25 gauge needle. Multiple
ultrasound images were saved for procedural documentation purposes.
The samples were prepared and submitted to pathology. Samples were
also collected for Afirma testing

Limited post procedural scanning was negative for hematoma or
additional complication. Dressings were placed. The patient
tolerated the above procedures procedure well without immediate
postprocedural complication.
FINDINGS: Nodule reference number based on prior diagnostic ultrasound: 1

Maximum size: 3.0 cm

Location: Right; Mid

ACR TI-RADS risk category: TR4 (4-6 points)

Reason for biopsy: meets ACR TI-RADS criteria

Ultrasound imaging confirms appropriate placement of the needles
within the thyroid nodule.
IMPRESSION: Technically successful ultrasound guided fine needle aspiration of
right mid thyroid nodule.

## 2020-11-13 IMAGING — DX DG CHEST 2V
2 series · 2 of 2 positions shown · non-contrast
Comparison: None.

CLINICAL DATA: Productive cough for 3 months.

EXAM:
CHEST - 2 VIEW

[chest pa]
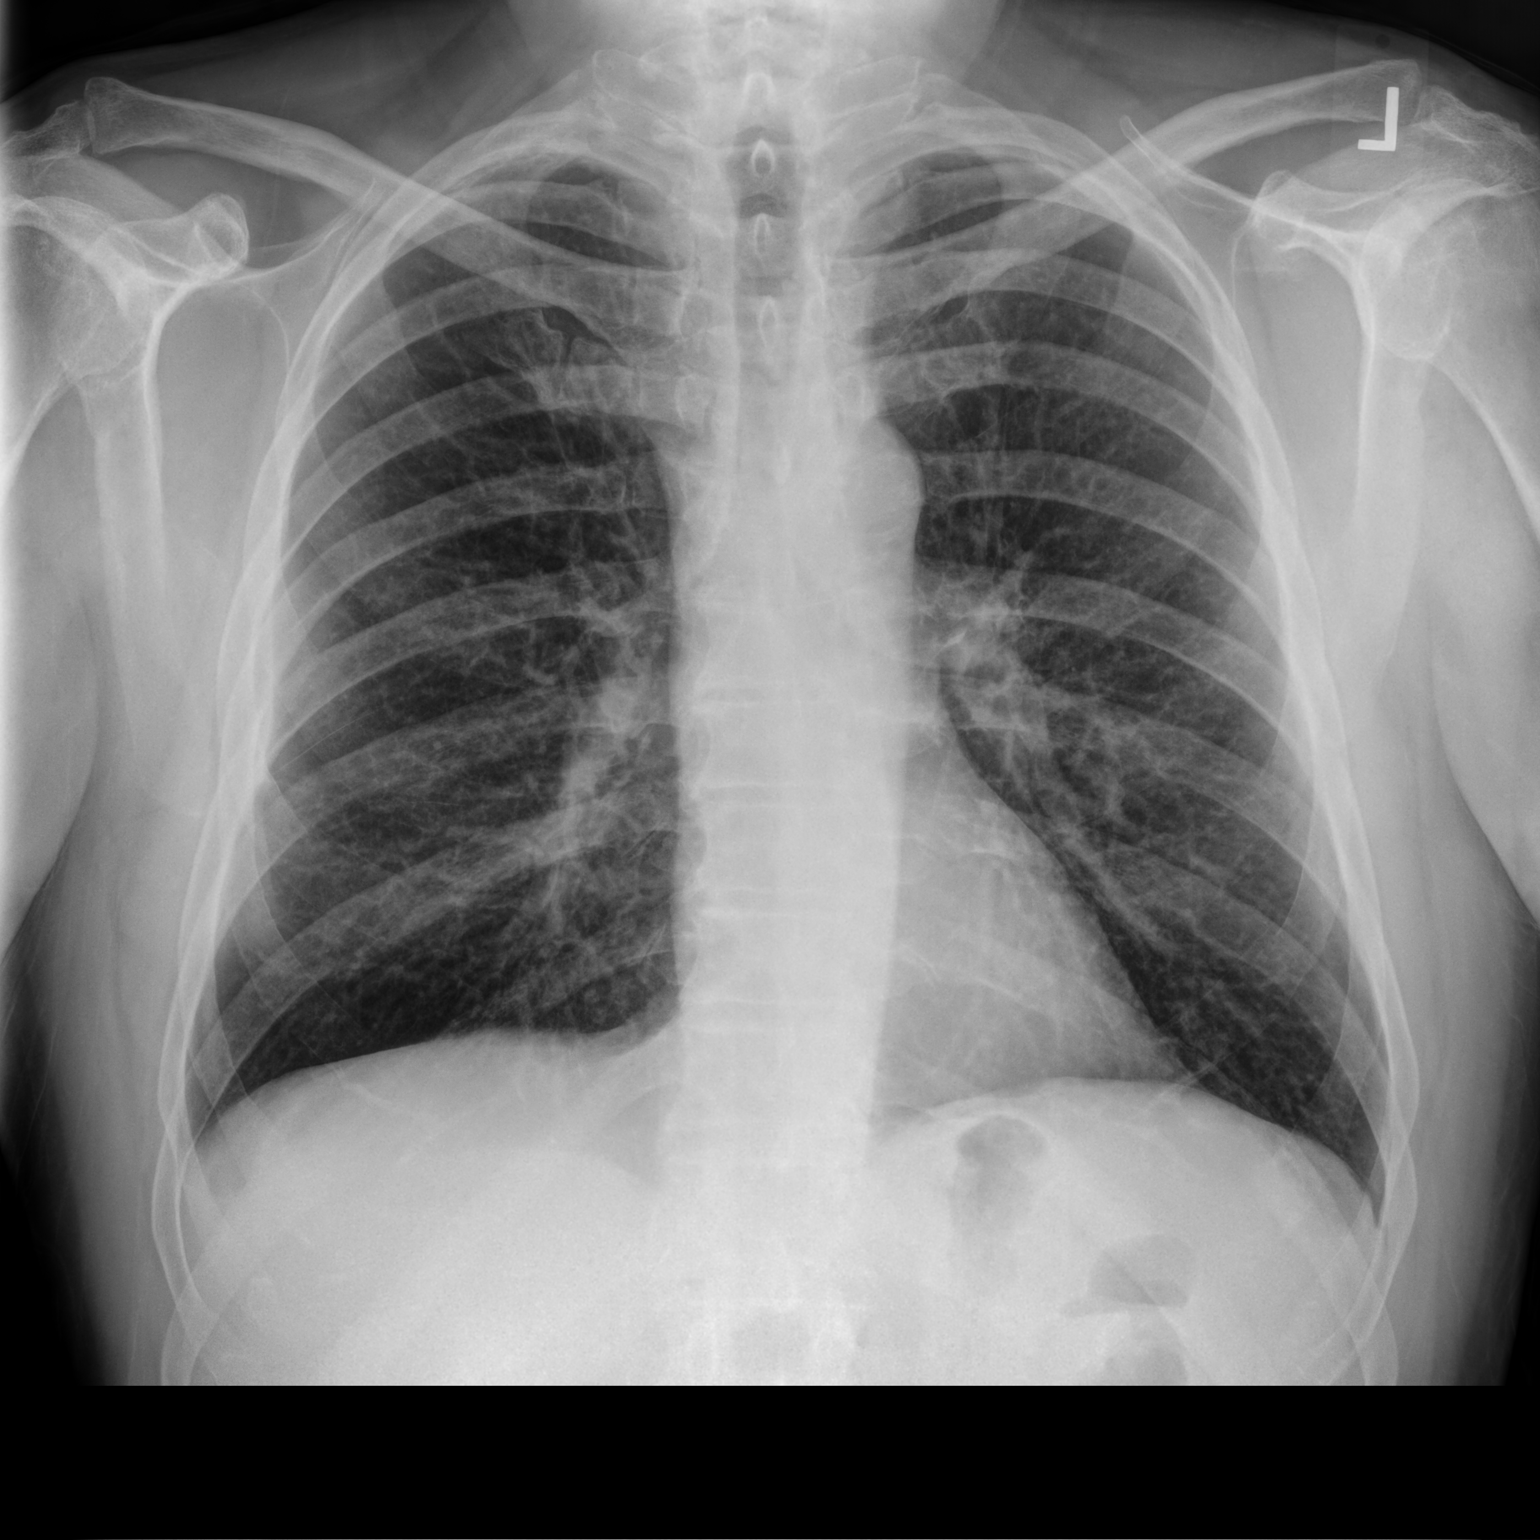

[chest lat]
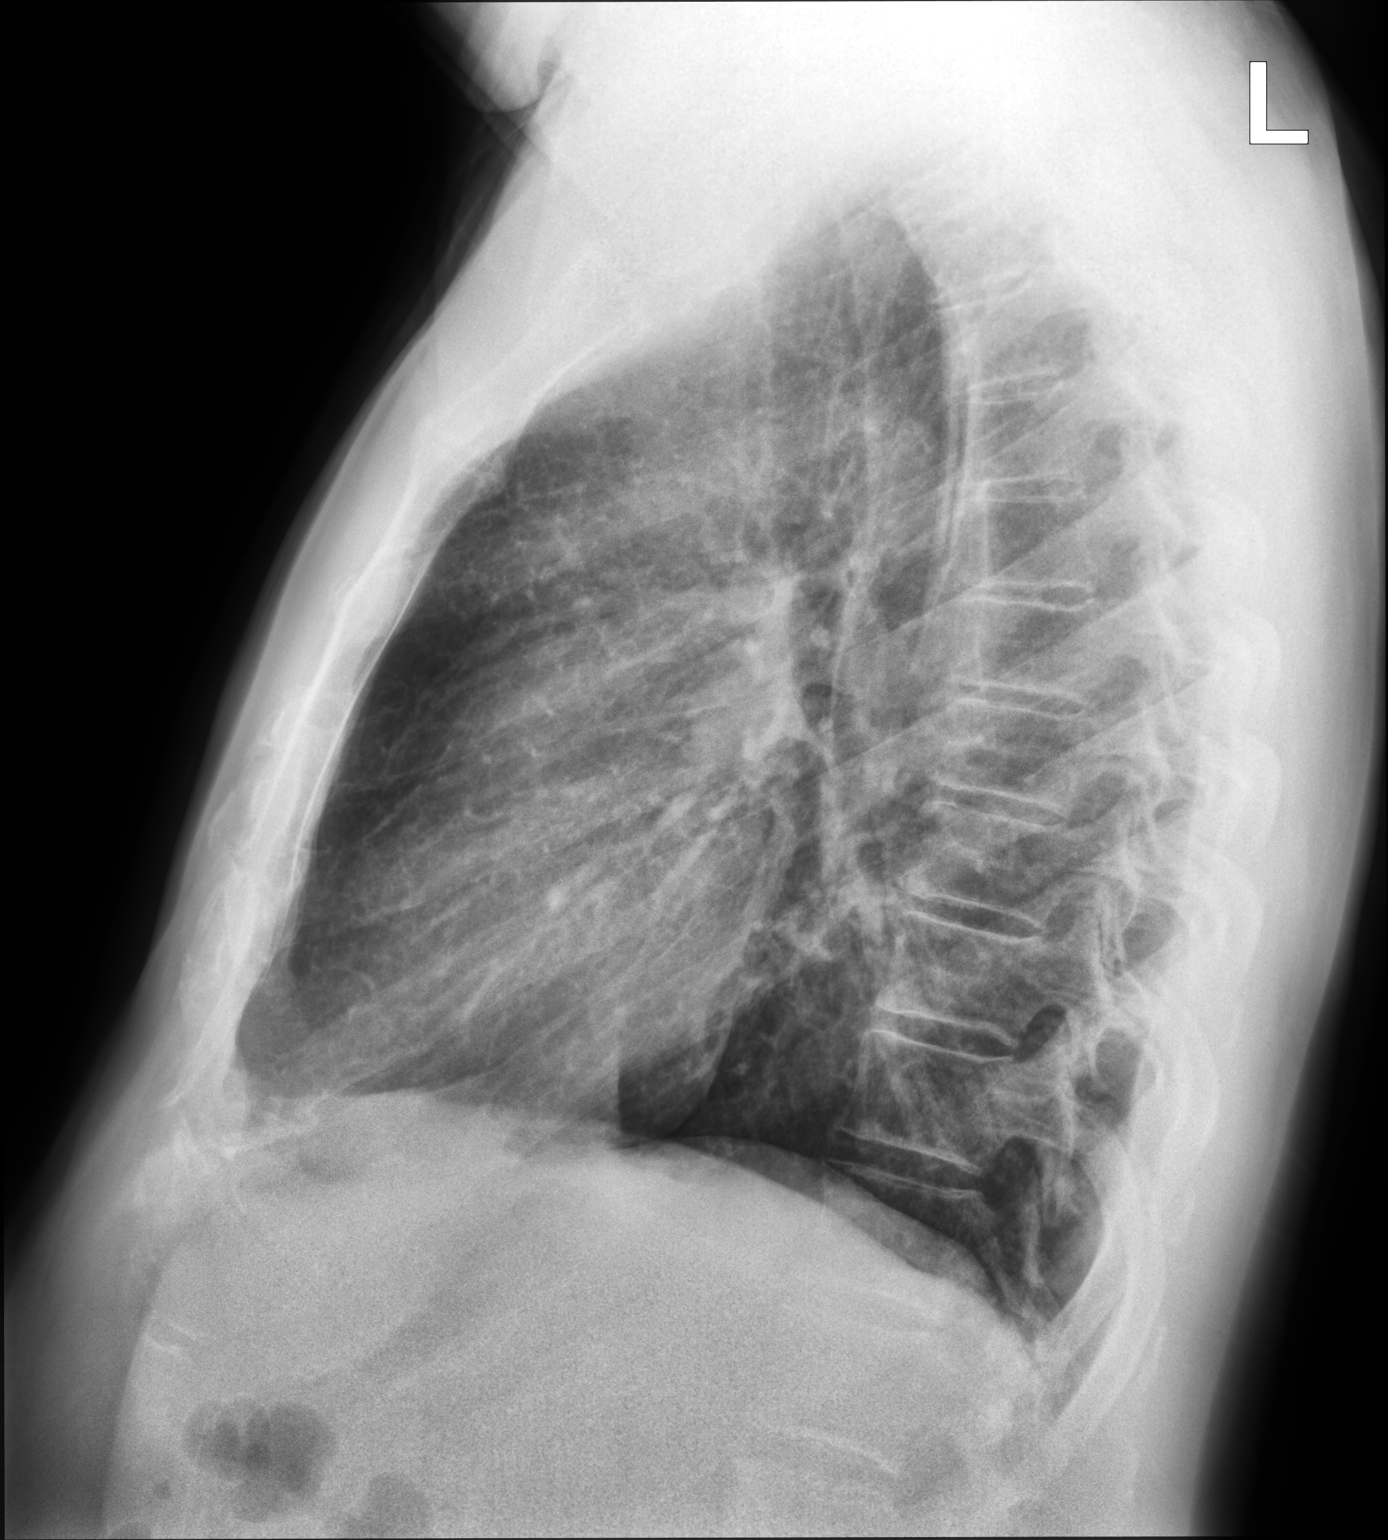

[2 of 2 positions shown; findings below may reference images not displayed]

FINDINGS: Lungs clear. Heart size normal. No pneumothorax or pleural fluid. No
acute or focal bony abnormality.
IMPRESSION: No acute disease.
